# Patient Record
Sex: Female | Born: 2000 | Race: White | Hispanic: No | Marital: Single | Smoking: Never smoker
Health system: Southern US, Community
[De-identification: ages and names within clinical notes are randomized; demographics above are authoritative.]

## PROBLEM LIST (undated history)

## (undated) DIAGNOSIS — Z87440 Personal history of urinary (tract) infections: Secondary | ICD-10-CM

## (undated) DIAGNOSIS — Z8679 Personal history of other diseases of the circulatory system: Secondary | ICD-10-CM

## (undated) HISTORY — DX: Personal history of other diseases of the circulatory system: Z86.79

## (undated) HISTORY — DX: Personal history of urinary (tract) infections: Z87.440

---
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
## 2018-01-10 ENCOUNTER — Other Ambulatory Visit (INDEPENDENT_AMBULATORY_CARE_PROVIDER_SITE_OTHER): Payer: Self-pay | Admitting: Pediatric Endocrinology

## 2018-01-13 ENCOUNTER — Other Ambulatory Visit (INDEPENDENT_AMBULATORY_CARE_PROVIDER_SITE_OTHER): Payer: Self-pay | Admitting: Internal Medicine

## 2018-01-13 ENCOUNTER — Telehealth: Payer: Self-pay | Admitting: Internal Medicine

## 2018-01-13 MED ORDER — NORGESTIMATE-ETH ESTRADIOL 0.25-35 MG-MCG PO TABS: 1.0000 | ORAL_TABLET | Freq: Every day | ORAL | 1 refills | Status: AC

## 2018-01-13 NOTE — Telephone Encounter (Signed)
Rx added to medlist and [facility].  Pt aware. Nothing further needed.

## 2018-01-13 NOTE — Telephone Encounter (Signed)
Copied from CRM #[id number]. Topic: Quick Communication [phone]- See Telephone Encounter >> Jan 13, 2018  9:47 AM Landry MellowFoltz, Melissa J wrote: CRM for notification. See Telephone encounter for: 01/13/18. Mom called -s he is asking for Dr Fabian SharpPanosh to write rx for estraylla that was originally prescribed from the pt's pediatric endocrinologist.  Pt has aged out of that practice.  [facility] [address]. Mom is asking for call if rx will not be approved.  Cb is [phone]

## 2018-01-13 NOTE — Telephone Encounter (Signed)
Pt's mother requesting rx for Normajean Baxterstarylla that was originally prescribed by the pt's pediatric endocrinologist. Pt has aged out of that practice. Pt's mother would like a return call if rx will not be approved.  LOV: 11/04/17  Dr. Fabian SharpPanosh  [facility] [address]

## 2018-01-13 NOTE — Telephone Encounter (Signed)
I dont see that on her med list  Is the a version of her OCP? Please put the medication on her med list .  Yes we can take over   ocps rx

## 2018-01-15 ENCOUNTER — Other Ambulatory Visit (INDEPENDENT_AMBULATORY_CARE_PROVIDER_SITE_OTHER): Payer: Self-pay | Admitting: Pediatric Endocrinology

## 2018-01-16 ENCOUNTER — Telehealth (INDEPENDENT_AMBULATORY_CARE_PROVIDER_SITE_OTHER): Payer: Self-pay | Admitting: Pediatric Endocrinology

## 2018-01-16 NOTE — Telephone Encounter (Signed)
Who's calling (name and relationship to patient) : [facility]  Best contact number: [phone] Provider they see: Dr. Vanessa DurhamBadik Reason for call: Mom called to request pt's refill on birth control. Dr. Fabian SharpPanosh handled the encounter.    Call ID: [phone]

## 2018-04-06 ENCOUNTER — Telehealth: Payer: Self-pay | Admitting: Internal Medicine

## 2018-04-06 NOTE — Telephone Encounter (Signed)
[facility].  Placed in Dr's folder.  Call [phone] upon completion.

## 2018-04-06 NOTE — Telephone Encounter (Signed)
Noted  

## 2018-04-10 ENCOUNTER — Ambulatory Visit (INDEPENDENT_AMBULATORY_CARE_PROVIDER_SITE_OTHER): Payer: Managed Care, Other (non HMO) | Admitting: *Deleted

## 2018-04-10 ENCOUNTER — Telehealth: Payer: Self-pay | Admitting: Internal Medicine

## 2018-04-10 DIAGNOSIS — Z111 Encounter for screening for respiratory tuberculosis: Secondary | ICD-10-CM | POA: Diagnosis not present

## 2018-04-10 NOTE — Progress Notes (Signed)
PPD Placement note Ann Chen, 17 y.o. female is here today with her mother for placement of PPD test Reason for PPD test: school requirement Pt taken PPD test before: no Verified in allergy area and with patient that they are not allergic to the products PPD is made of (Phenol or Tween). Yes  Has the patient been in recent contact with anyone known or suspected of having active TB disease?: no  O: Alert and oriented in NAD. P:  PPD placed on 04/10/2018.  Patient advised to return for reading within 48-72 hours.

## 2018-04-11 NOTE — Telephone Encounter (Signed)
disregard

## 2018-04-12 LAB — TB SKIN TEST
Induration: 0 mm
TB Skin Test: NEGATIVE

## 2018-12-14 ENCOUNTER — Other Ambulatory Visit (INDEPENDENT_AMBULATORY_CARE_PROVIDER_SITE_OTHER): Payer: Self-pay | Admitting: Pediatric Endocrinology
